# Patient Record
Sex: Male | Born: 1990 | Hispanic: Yes | Marital: Single
Health system: Southern US, Community
[De-identification: ages and names within clinical notes are randomized; demographics above are authoritative.]

---

## 2015-08-06 ENCOUNTER — Emergency Department (HOSPITAL_COMMUNITY): Payer: Self-pay

## 2015-08-06 ENCOUNTER — Emergency Department (HOSPITAL_COMMUNITY)
Admission: EM | Admit: 2015-08-06 | Discharge: 2015-08-06 | Disposition: A | Discharge status: Home / Self Care | Payer: Self-pay | Attending: Emergency Medicine | Admitting: Emergency Medicine

## 2015-08-06 ENCOUNTER — Encounter (HOSPITAL_COMMUNITY): Payer: Self-pay | Admitting: Emergency Medicine

## 2015-08-06 DIAGNOSIS — Y998 Other external cause status: Secondary | ICD-10-CM | POA: Insufficient documentation

## 2015-08-06 DIAGNOSIS — W1839XA Other fall on same level, initial encounter: Secondary | ICD-10-CM | POA: Insufficient documentation

## 2015-08-06 DIAGNOSIS — F10920 Alcohol use, unspecified with intoxication, uncomplicated: Secondary | ICD-10-CM

## 2015-08-06 DIAGNOSIS — Y9289 Other specified places as the place of occurrence of the external cause: Secondary | ICD-10-CM | POA: Insufficient documentation

## 2015-08-06 DIAGNOSIS — S59911A Unspecified injury of right forearm, initial encounter: Secondary | ICD-10-CM | POA: Insufficient documentation

## 2015-08-06 DIAGNOSIS — F10120 Alcohol abuse with intoxication, uncomplicated: Secondary | ICD-10-CM | POA: Insufficient documentation

## 2015-08-06 DIAGNOSIS — Y9389 Activity, other specified: Secondary | ICD-10-CM | POA: Insufficient documentation

## 2015-08-06 LAB — CBC WITH DIFFERENTIAL/PLATELET
BASOS ABS: 0 10*3/uL (ref 0.0–0.1)
BASOS PCT: 0 %
EOS PCT: 0 %
Eosinophils Absolute: 0 10*3/uL (ref 0.0–0.7)
HCT: 45.9 % (ref 39.0–52.0)
Hemoglobin: 15.8 g/dL (ref 13.0–17.0)
Lymphocytes Relative: 21 %
Lymphs Abs: 1.8 10*3/uL (ref 0.7–4.0)
MCH: 27.8 pg (ref 26.0–34.0)
MCHC: 34.4 g/dL (ref 30.0–36.0)
MCV: 80.8 fL (ref 78.0–100.0)
MONO ABS: 0.4 10*3/uL (ref 0.1–1.0)
Monocytes Relative: 4 %
Neutro Abs: 6.4 10*3/uL (ref 1.7–7.7)
Neutrophils Relative %: 75 %
PLATELETS: 247 10*3/uL (ref 150–400)
RBC: 5.68 MIL/uL (ref 4.22–5.81)
RDW: 13.2 % (ref 11.5–15.5)
WBC: 8.5 10*3/uL (ref 4.0–10.5)

## 2015-08-06 LAB — COMPREHENSIVE METABOLIC PANEL
ALBUMIN: 4.7 g/dL (ref 3.5–5.0)
ALT: 94 U/L — ABNORMAL HIGH (ref 17–63)
ANION GAP: 7 (ref 5–15)
AST: 49 U/L — AB (ref 15–41)
Alkaline Phosphatase: 92 U/L (ref 38–126)
BUN: 10 mg/dL (ref 6–20)
CHLORIDE: 111 mmol/L (ref 101–111)
CO2: 22 mmol/L (ref 22–32)
Calcium: 8.6 mg/dL — ABNORMAL LOW (ref 8.9–10.3)
Creatinine, Ser: 0.64 mg/dL (ref 0.61–1.24)
GFR calc Af Amer: >60 mL/min (ref >60)
GLUCOSE: 140 mg/dL — AB (ref 65–99)
POTASSIUM: 3.6 mmol/L (ref 3.5–5.1)
Sodium: 140 mmol/L (ref 135–145)
Total Bilirubin: 0.6 mg/dL (ref 0.3–1.2)
Total Protein: 7.6 g/dL (ref 6.5–8.1)

## 2015-08-06 LAB — ETHANOL: ALCOHOL ETHYL (B): 167 mg/dL — AB (ref <5)

## 2015-08-06 NOTE — ED Notes (Signed)
Bed: ZO10 Expected date:  Expected time:  Means of arrival:  Comments: Fall, obvious deformity

## 2015-08-06 NOTE — ED Provider Notes (Signed)
CSN: [id number]     Arrival date & time 08/06/15  0240 History  By signing my name below, I, Austin Shea, attest that this documentation has been prepared under the direction and in the presence of Austin Racer, MD. Electronically Signed: Evon Shea, ED Scribe. 08/06/2015. 5:35 AM.      Chief Complaint  Patient presents with  . Fall    23 yom brought in by EMS after family called 911 for right arm pain secondary after fall. EMS splinted arm. Glucose 140. #20g Left hand, EMS gave zofran  IV.   Marland Kitchen Alcohol Intoxication   The history is provided by the patient. No language interpreter was used.   HPI Comments: Level 5 Caveat: alcohol intoxication. Austin Shea is a 24 y.o. male brought in by EMS to the Emergency Department complaining of fall onset PTA. Per Ems pt is complaining of right arm pain. Pt denies head injury or LOC. Pt does report ETOH use tonight. Pt denies any other pain. Per EMS pt has had Iv Zofran PTA.   History reviewed. No pertinent past medical history. History reviewed. No pertinent past surgical history. History reviewed. No pertinent family history. Social History  Substance Use Topics  . Smoking status: Unknown If Ever Smoked  . Smokeless tobacco: None  . Alcohol Use: Yes    Review of Systems  Unable to perform ROS: Other  Musculoskeletal: Positive for arthralgias.  Skin: Positive for wound.     Allergies  Review of patient's allergies indicates no known allergies.  Home Medications   Prior to Admission medications   Not on File   BP 115/74 mmHg  Pulse 90  Temp(Src) 98 F (36.7 C) (Oral)  Resp 13  SpO2 96%   Physical Exam  Constitutional: He appears well-developed and well-nourished. No distress.  Patient with sonorous respirations but arousable  HENT:  Head: Normocephalic and atraumatic.  Mouth/Throat: Oropharynx is clear and moist. No oropharyngeal exudate.  Eyes: EOM are normal. Pupils are equal, round, and reactive to  light.  Neck: Normal range of motion. Neck supple.  No posterior midline cervical tenderness to palpation. No meningismus.  Cardiovascular: Normal rate and regular rhythm.  Exam reveals no gallop and no friction rub.   No murmur heard. Pulmonary/Chest: Effort normal and breath sounds normal. No respiratory distress. He has no wheezes. He has no rales.  Abdominal: Soft. Bowel sounds are normal. He exhibits no distension and no mass. There is no tenderness. There is no rebound and no guarding.  Musculoskeletal: Normal range of motion. He exhibits tenderness. He exhibits no edema.  Patient with mild tenderness to the right wrist. There is no snuffbox tenderness. No obvious deformity. Patient does have a small abrasion to the right elbow.  Neurological:  Sleeping soundly but arousable. Moves all extremities.  Skin: Skin is warm and dry. No rash noted. No erythema.  Psychiatric: He has a normal mood and affect. His behavior is normal.  Nursing note and vitals reviewed.   ED Course  Procedures (including critical care time) DIAGNOSTIC STUDIES: Oxygen Saturation is 96% on [place], adequate by my interpretation.    COORDINATION OF CARE:    Labs Review Labs Reviewed  COMPREHENSIVE METABOLIC PANEL - Abnormal; Notable for the following:    Glucose, Bld 140 (*)    Calcium 8.6 (*)    AST 49 (*)    ALT 94 (*)    All other components within normal limits  ETHANOL - Abnormal; Notable for the following:  Alcohol, Ethyl (B) 167 (*)    All other components within normal limits  CBC WITH DIFFERENTIAL/PLATELET  URINE RAPID DRUG SCREEN, HOSP PERFORMED    Imaging Review Dg Wrist Complete Right  08/06/2015   CLINICAL DATA:  Ethanol use with fall and wrist swelling. Initial encounter.  EXAM: RIGHT WRIST - COMPLETE 3+ VIEW  COMPARISON:  None.  FINDINGS: There is no evidence of fracture or dislocation. There is no evidence of arthropathy or other focal bone abnormality. Soft tissues are unremarkable.   IMPRESSION: Negative.   Electronically Signed   By: Marnee Spring M.D.   On: 08/06/2015 03:25      EKG Interpretation None      MDM   Final diagnoses:  Alcohol intoxication, uncomplicated    I personally performed the services described in this documentation, which was scribed in my presence. The recorded information has been reviewed and is accurate.   Patient is now much more alert and sitting up. Answering questions appropriately. Denies any pain currently. His mother is at bedside. We'll take him home. There is no obvious head trauma. Imaging is necessary at this point. Return precautions given.   Austin Racer, MD 08/06/15 [phone]

## 2015-08-06 NOTE — ED Notes (Addendum)
23 yom brought in by EMS after family called 911 for right arm pain secondary after fall. EMS splinted arm. Glucose 140. #20g Left hand, EMS gave zofran  IV.

## 2015-08-06 NOTE — Discharge Instructions (Signed)
Intoxicacin alcohlica (Alcohol Intoxication) La intoxicacin alcohlica se produce cuando la cantidad de alcohol que se ha consumido daa la capacidad de funcionamiento mental y fsico. El alcohol deteriora directamente la actividad qumica normal del cerebro. Beber grandes cantidades de alcohol puede conducir a Insurance underwriter funcionamiento mental y en el comportamiento, y puede causar muchos efectos fsicos que pueden ser perjudiciales.  La intoxicacin alcohlica puede variar en gravedad desde leve hasta muy grave. Hay varios factores que pueden afectar el nivel de intoxicacin que se produce, como la edad de la persona, el sexo, el peso, la frecuencia de consumo de alcohol, y la presencia de otras enfermedades mdicas (como diabetes, convulsiones o enfermedades del corazn). Los niveles peligrosos de intoxicacin por alcohol pueden ocurrir [employer] personas beben grandes cantidades de alcohol en un corto periodo de tiempo ([place]). El alcohol tambin puede ser especialmente peligroso cuando se combina con ciertos medicamentos recetados o drogas "recreativas". SIGNOS Y SNTOMAS Algunos de los signos y sntomas comunes de intoxicacin leve por alcohol incluyen:  Prdida de la coordinacin.  Cambios en el estado de nimo y la conducta.  Incapacidad para razonar.  Hablar arrastrando las palabras. A medida que la intoxicacin por alcohol avanza a niveles ms graves, Lucianne Lei a Arts administrator otros signos y sntomas. Estos pueden ser:  Vmitos.  Confusin y alteracin de Sales promotion account executive.  Disminucin de Secretary/administrator.  Convulsiones.  Prdida de la conciencia. DIAGNSTICO  El mdico le har una historia clnica y un examen fsico. Se le preguntar acerca de la cantidad y el tipo de alcohol que ha consumido. Se le realizarn anlisis de sangre para medir la concentracin de alcohol en sangre. En muchos lugares, el nivel de alcohol en la sangre debe ser inferior a 80 mg / dL (0,08%) para  poder conducir legalmente. Sin embargo, hay muchos efectos peligrosos del alcohol que pueden ocurrir con niveles mucho ms bajos.  [place] con intoxicacin por alcohol a menudo no requieren Clinical research associate. La mayor parte de los efectos de la intoxicacin por alcohol son temporales, y desaparecen a medida que el alcohol abandona el cuerpo de forma natural. El profesional controlar su estado hasta que est lo suficientemente estable como para volver a casa. A veces se administran lquidos por va intravenosa para ayudar a evitar la deshidratacin.  INSTRUCCIONES PARA EL CUIDADO EN EL HOGAR  No conduzca vehculos despus de beber alcohol.  Mantngase hidratado. Beba gran cantidad de lquido para mantener la orina de tono claro o color amarillo plido. Evite la cafena.   Tome slo medicamentos de venta libre o recetados, segn las indicaciones del mdico.  SOLICITE ATENCIN MDICA SI:   Tiene vmitos persistentes.   No mejora luego de [employer].  Se intoxica con alcohol con frecuencia. El mdico podr ayudarlo a decidir si debe consultar a un terapeuta especializado en el abuso de sustancias. SOLICITE ATENCIN MDICA DE INMEDIATO SI:   Se siente vacilante o tembloroso cuando trata de abandonar el hbito.   Comienza a temblar de manera incontrolable (convulsiones).   Vomita sangre. Puede ser sangre de color rojo brillante o similar al sedimento del caf negro.   Lollie Marrow en la materia fecal. Puede ser de color rojo brillante o de aspecto alquitranado, con olor ftido.   Se siente mareado o se desmaya.  ASEGRESE DE QUE:   Comprende estas instrucciones.  Controlar su afeccin.  Recibir ayuda de inmediato si no mejora o si empeora. Document Released: 10/25/2005 Document Revised: 06/27/2013 Exit[employer] Patient Information  2015 [employer]. This information is not intended to replace advice given to you by your health care provider. Make sure you  discuss any questions you have with your health care provider. ° °

## 2015-08-06 NOTE — ED Notes (Signed)
Pt's O2 level went down to 88.   EMS said that he would drop when sleep.   Returned to mask.  Informed RN.   Pt is now at 100 %.

## 2016-10-08 IMAGING — CR DG WRIST COMPLETE 3+V*R*
4 series · 4 of 4 positions shown · non-contrast
Comparison: None.

CLINICAL DATA: Ethanol use with fall and wrist swelling. Initial
encounter.

EXAM:
RIGHT WRIST - COMPLETE 3+ VIEW

[x wrist pa right]
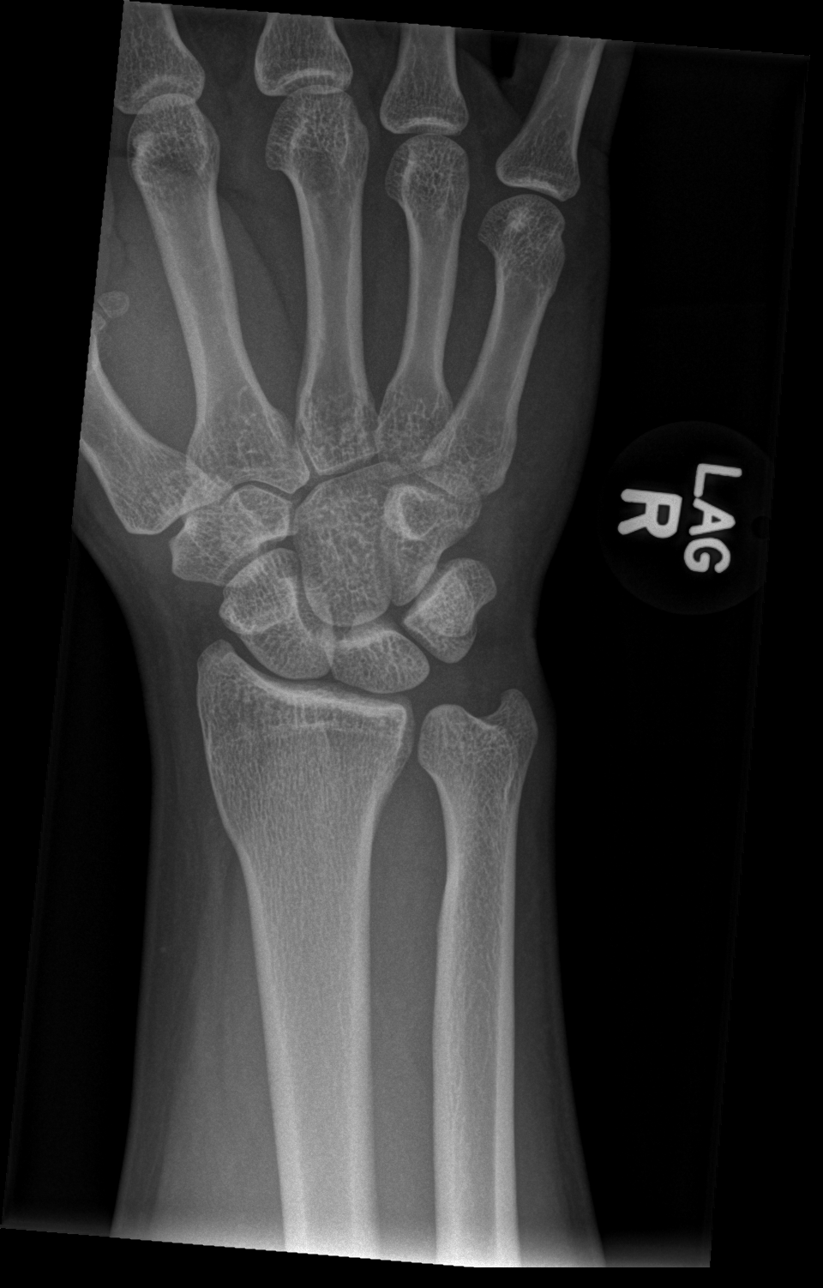

[x wrist obl right (1 of 2)]
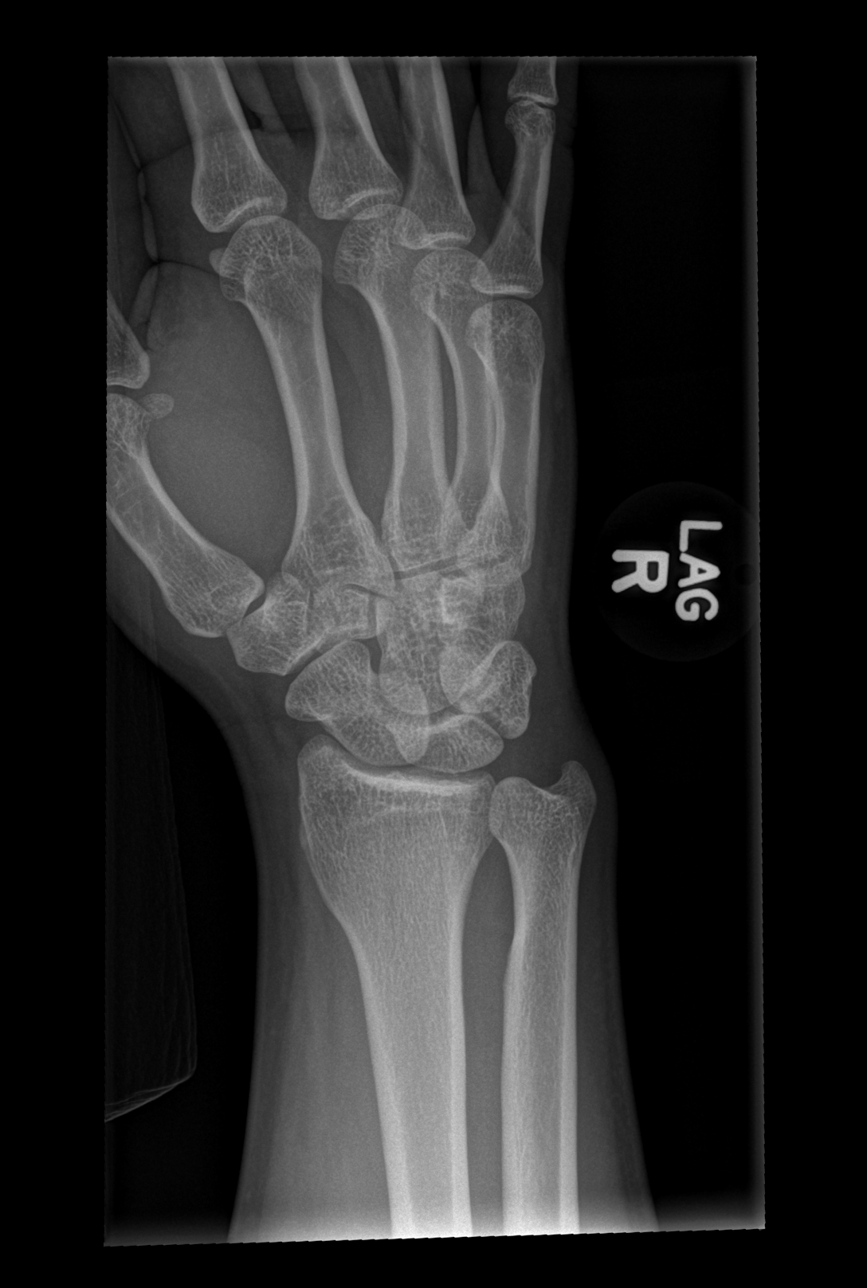

[x wrist obl right (2 of 2)]
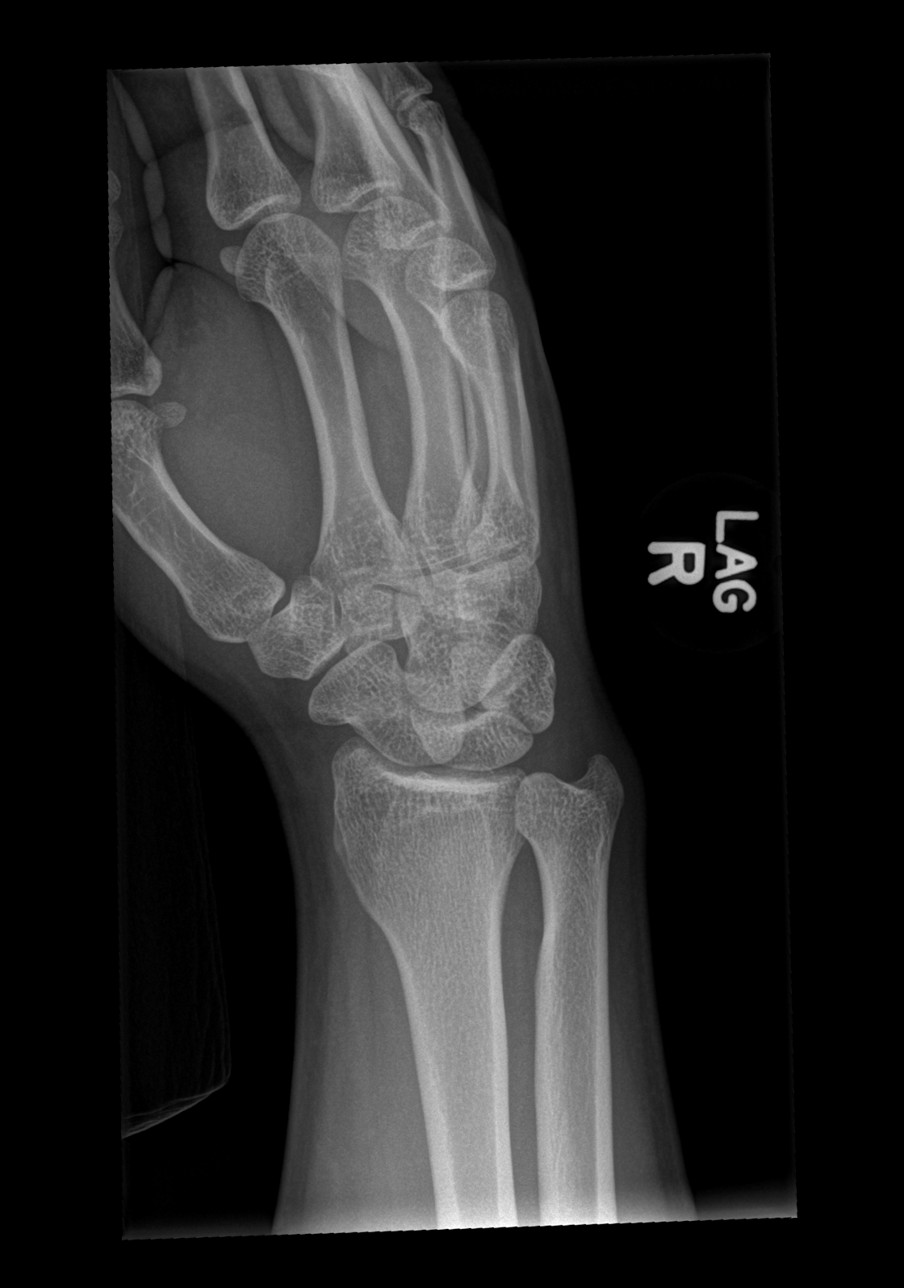

[x wrist lat right]
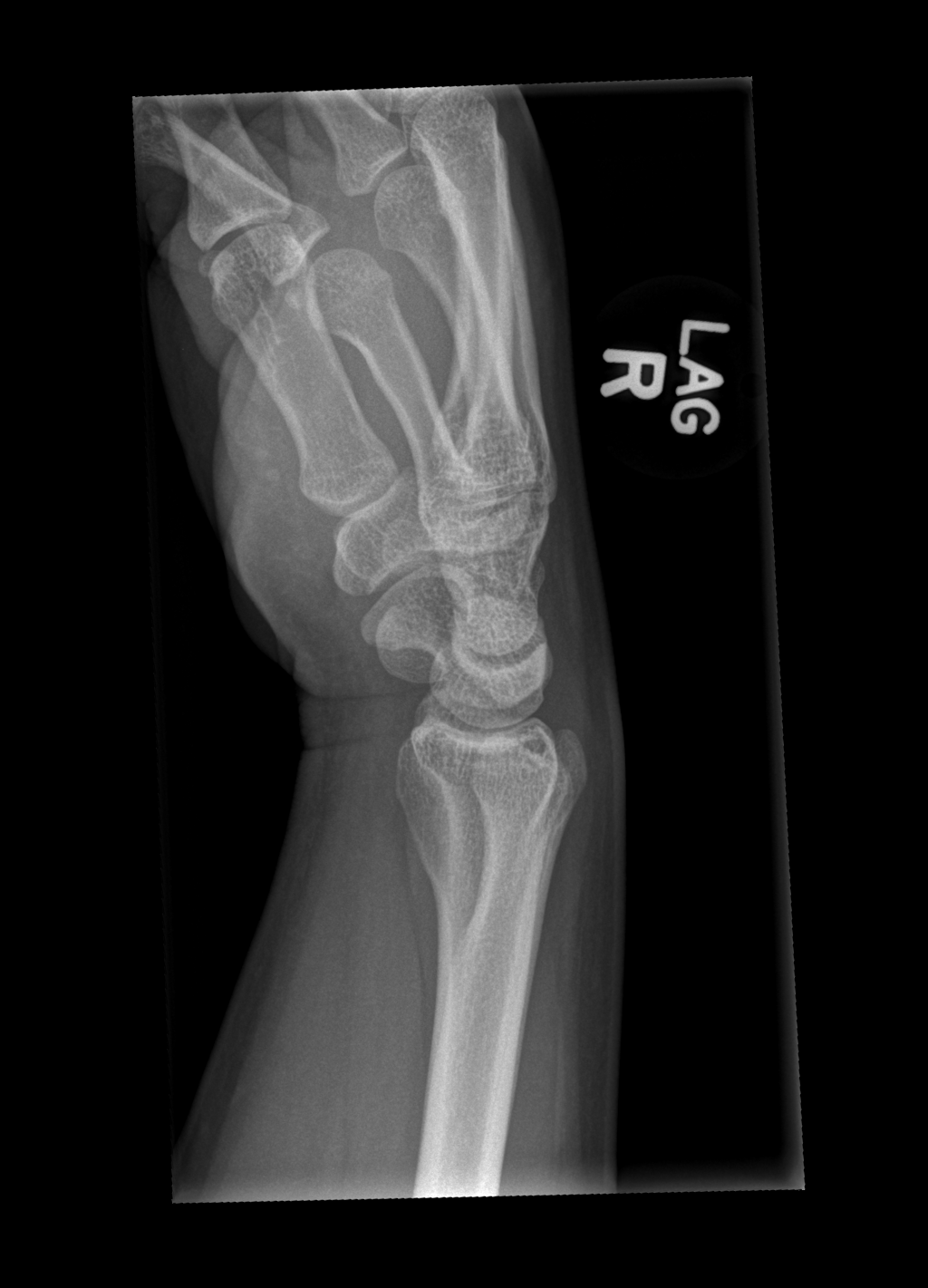

[4 of 4 positions shown; findings below may reference images not displayed]

FINDINGS: There is no evidence of fracture or dislocation. There is no
evidence of arthropathy or other focal bone abnormality. Soft
tissues are unremarkable.
IMPRESSION: Negative.
